# Patient Record
Sex: Male | Born: 1955 | Race: White | Hispanic: No | Marital: Married | Smoking: Current every day smoker
Health system: Southern US, Community
[De-identification: ages and names within clinical notes are randomized; demographics above are authoritative.]

## PROBLEM LIST (undated history)

## (undated) DIAGNOSIS — E785 Hyperlipidemia, unspecified: Secondary | ICD-10-CM

## (undated) DIAGNOSIS — K5792 Diverticulitis of intestine, part unspecified, without perforation or abscess without bleeding: Secondary | ICD-10-CM

## (undated) HISTORY — PX: HEMORRHOID SURGERY: SHX153

## (undated) HISTORY — DX: Diverticulitis of intestine, part unspecified, without perforation or abscess without bleeding: K57.92

## (undated) HISTORY — PX: VASECTOMY: SHX75

## (undated) HISTORY — PX: APPENDECTOMY: SHX54

---

## 2001-07-07 ENCOUNTER — Encounter: Payer: Self-pay | Admitting: Family Medicine

## 2001-07-07 ENCOUNTER — Ambulatory Visit (HOSPITAL_COMMUNITY): Admission: RE | Admit: 2001-07-07 | Discharge: 2001-07-07 | Payer: Self-pay | Admitting: Family Medicine

## 2009-09-26 ENCOUNTER — Emergency Department (HOSPITAL_COMMUNITY): Admission: EM | Admit: 2009-09-26 | Discharge: 2009-09-26 | Payer: Self-pay | Admitting: Emergency Medicine

## 2009-10-01 ENCOUNTER — Encounter: Admission: RE | Admit: 2009-10-01 | Discharge: 2009-10-01 | Payer: Self-pay | Admitting: Family Medicine

## 2009-10-17 ENCOUNTER — Encounter: Admission: RE | Admit: 2009-10-17 | Discharge: 2009-10-17 | Payer: Self-pay | Admitting: Family Medicine

## 2012-03-22 ENCOUNTER — Encounter (INDEPENDENT_AMBULATORY_CARE_PROVIDER_SITE_OTHER): Payer: Self-pay | Admitting: *Deleted

## 2015-11-17 ENCOUNTER — Telehealth: Payer: Self-pay

## 2015-11-17 NOTE — Telephone Encounter (Signed)
[phone]   PATIENT RECEIVED LETTER TO SCHEDULE TCS

## 2015-12-04 NOTE — Telephone Encounter (Signed)
LMOM to call.

## 2015-12-09 NOTE — Telephone Encounter (Signed)
LMOM to call.

## 2015-12-11 NOTE — Telephone Encounter (Signed)
Triaged pt today. See separate triage.

## 2015-12-17 NOTE — Telephone Encounter (Signed)
Gastroenterology Pre-Procedure Review  Request Date: 12/11/2015 Requesting Physician: Dr. Wende Neighbors  PATIENT REVIEW QUESTIONS: The patient responded to the following health history questions as indicated:    1. Diabetes Melitis: no 2. Joint replacements in the past 12 months: no 3. Major health problems in the past 3 months: no 4. Has an artificial valve or MVP: no 5. Has a defibrillator: no 6. Has been advised in past to take antibiotics in advance of a procedure like teeth cleaning: no 7. Family history of colon cancer: no  8. Alcohol Use: USUALLY DRINKS 2-3 BEERS DAILY 9. History of sleep apnea: no     MEDICATIONS & ALLERGIES:    Patient reports the following regarding taking any blood thinners:   Plavix? no Aspirin? yes Coumadin? no  Patient confirms/reports the following medications:  Current Outpatient Prescriptions  Medication Sig Dispense Refill  . aspirin 81 MG chewable tablet Chew by mouth daily.    Marland Kitchen atorvastatin (LIPITOR) 20 MG tablet Take 20 mg by mouth daily.    . Multiple Vitamin (MULTIVITAMIN) tablet Take 1 tablet by mouth daily.     No current facility-administered medications for this visit.     Patient confirms/reports the following allergies:  No Known Allergies  No orders of the defined types were placed in this encounter.   AUTHORIZATION INFORMATION Primary Insurance:  ID #:   Group #:  Pre-Cert / Auth required: Pre-Cert / Auth #:   Secondary Insurance:   ID #:   Group #:  Pre-Cert / Auth required: Pre-Cert / Auth #:   SCHEDULE INFORMATION: Procedure has been scheduled as follows:  Date:                  Time:   Location:   This Gastroenterology Pre-Precedure Review Form is being routed to the following provider(s): R. Garfield Cornea, MD

## 2015-12-22 NOTE — Telephone Encounter (Signed)
Phenergan 25 mg IV on call.  

## 2015-12-31 ENCOUNTER — Other Ambulatory Visit: Payer: Self-pay

## 2015-12-31 DIAGNOSIS — Z1211 Encounter for screening for malignant neoplasm of colon: Secondary | ICD-10-CM

## 2015-12-31 MED ORDER — PROMETHAZINE HCL 25 MG/ML IJ SOLN: 25.0000 mg | Freq: Once | INTRAMUSCULAR | Status: DC

## 2015-12-31 MED ORDER — NA SULFATE-K SULFATE-MG SULF 17.5-3.13-1.6 GM/177ML PO SOLN: 1.0000 | ORAL | 0 refills | Status: DC

## 2015-12-31 NOTE — Addendum Note (Signed)
Addended by: Everardo All on: 12/31/2015 11:22 AM   Modules accepted: Orders

## 2015-12-31 NOTE — Telephone Encounter (Signed)
PT called back and is scheduled for 01/21/2016 at 9:30 Am with Dr. Dudley Major.

## 2015-12-31 NOTE — Telephone Encounter (Signed)
Rx sent to the pharmacy and instructions mailed to pt.  

## 2015-12-31 NOTE — Telephone Encounter (Signed)
LMOM for a return call for date and time for procedure with Dr. Gala Romney.

## 2016-01-02 ENCOUNTER — Telehealth: Payer: Self-pay

## 2016-01-02 NOTE — Telephone Encounter (Signed)
Pt's wife is called and wanted to know if they could chang e the day of his TCS from 01/21/16 to another day. Please call her back

## 2016-01-05 ENCOUNTER — Telehealth: Payer: Self-pay

## 2016-01-05 NOTE — Telephone Encounter (Signed)
Pt's wife called and rescheduled the colonoscopy from 01/21/2016 to 02/04/2016 at 10:30 Am. I will mail new instructions.

## 2016-01-06 NOTE — Telephone Encounter (Signed)
See phone note of 01/05/2016.

## 2016-01-29 ENCOUNTER — Telehealth: Payer: Self-pay

## 2016-01-29 NOTE — Telephone Encounter (Signed)
I called [employer] @ [phone] and spoke to Lequita Asal who said that a PA is not required for a screening colonoscopy.  Reference # [id number].

## 2016-02-04 ENCOUNTER — Encounter (HOSPITAL_COMMUNITY)
Admission: RE | Disposition: A | Discharge status: Home / Self Care | Payer: Self-pay | Source: Ambulatory Visit | Attending: Internal Medicine

## 2016-02-04 ENCOUNTER — Ambulatory Visit (HOSPITAL_COMMUNITY)
Admission: RE | Admit: 2016-02-04 | Discharge: 2016-02-04 | Disposition: A | Discharge status: Home / Self Care | Payer: BLUE CROSS/BLUE SHIELD | Source: Ambulatory Visit | Attending: Internal Medicine | Admitting: Internal Medicine

## 2016-02-04 ENCOUNTER — Encounter (HOSPITAL_COMMUNITY): Payer: Self-pay | Admitting: *Deleted

## 2016-02-04 DIAGNOSIS — K5730 Diverticulosis of large intestine without perforation or abscess without bleeding: Secondary | ICD-10-CM | POA: Insufficient documentation

## 2016-02-04 DIAGNOSIS — Z1212 Encounter for screening for malignant neoplasm of rectum: Secondary | ICD-10-CM | POA: Diagnosis not present

## 2016-02-04 DIAGNOSIS — F17200 Nicotine dependence, unspecified, uncomplicated: Secondary | ICD-10-CM | POA: Diagnosis not present

## 2016-02-04 DIAGNOSIS — Z7982 Long term (current) use of aspirin: Secondary | ICD-10-CM | POA: Diagnosis not present

## 2016-02-04 DIAGNOSIS — D125 Benign neoplasm of sigmoid colon: Secondary | ICD-10-CM | POA: Diagnosis not present

## 2016-02-04 DIAGNOSIS — Z1211 Encounter for screening for malignant neoplasm of colon: Secondary | ICD-10-CM

## 2016-02-04 DIAGNOSIS — Z79899 Other long term (current) drug therapy: Secondary | ICD-10-CM | POA: Diagnosis not present

## 2016-02-04 DIAGNOSIS — E785 Hyperlipidemia, unspecified: Secondary | ICD-10-CM | POA: Diagnosis not present

## 2016-02-04 DIAGNOSIS — D123 Benign neoplasm of transverse colon: Secondary | ICD-10-CM | POA: Insufficient documentation

## 2016-02-04 DIAGNOSIS — D128 Benign neoplasm of rectum: Secondary | ICD-10-CM | POA: Insufficient documentation

## 2016-02-04 HISTORY — DX: Hyperlipidemia, unspecified: E78.5

## 2016-02-04 HISTORY — PX: COLONOSCOPY: SHX5424

## 2016-02-04 SURGERY — COLONOSCOPY
Anesthesia: Moderate Sedation

## 2016-02-04 MED ORDER — MEPERIDINE HCL 100 MG/ML IJ SOLN
INTRAMUSCULAR | Status: AC
  Filled 2016-02-04: qty 2

## 2016-02-04 MED ORDER — MEPERIDINE HCL 100 MG/ML IJ SOLN
INTRAMUSCULAR | Status: DC | PRN
  Administered 2016-02-04: 50 mg via INTRAVENOUS
  Administered 2016-02-04 (×2): 25 mg via INTRAVENOUS
  Administered 2016-02-04: 50 mg via INTRAVENOUS

## 2016-02-04 MED ORDER — ONDANSETRON HCL 4 MG/2ML IJ SOLN
INTRAMUSCULAR | Status: DC | PRN
  Administered 2016-02-04: 4 mg via INTRAVENOUS

## 2016-02-04 MED ORDER — SODIUM CHLORIDE 0.9% FLUSH
INTRAVENOUS | Status: AC
  Filled 2016-02-04: qty 10

## 2016-02-04 MED ORDER — PROMETHAZINE HCL 25 MG/ML IJ SOLN: 12.5000 mg | Freq: Four times a day (QID) | INTRAMUSCULAR | Status: DC | PRN

## 2016-02-04 MED ORDER — SODIUM CHLORIDE 0.9 % IV SOLN
INTRAVENOUS | Status: DC
  Administered 2016-02-04: 1000 mL via INTRAVENOUS

## 2016-02-04 MED ORDER — ONDANSETRON HCL 4 MG/2ML IJ SOLN
INTRAMUSCULAR | Status: AC
  Filled 2016-02-04: qty 2

## 2016-02-04 MED ORDER — MIDAZOLAM HCL 5 MG/5ML IJ SOLN
INTRAMUSCULAR | Status: AC
  Filled 2016-02-04: qty 10

## 2016-02-04 MED ORDER — PROMETHAZINE HCL 25 MG/ML IJ SOLN
INTRAMUSCULAR | Status: AC
  Filled 2016-02-04: qty 1

## 2016-02-04 MED ORDER — MIDAZOLAM HCL 5 MG/5ML IJ SOLN
INTRAMUSCULAR | Status: DC | PRN
  Administered 2016-02-04: 2 mg via INTRAVENOUS
  Administered 2016-02-04: 1 mg via INTRAVENOUS
  Administered 2016-02-04: 2 mg via INTRAVENOUS
  Administered 2016-02-04: 1 mg via INTRAVENOUS

## 2016-02-04 NOTE — H&P (Signed)
@  [email or web address]@   Primary Care Physician:  Pcp Not In System  Dr. Wende Neighbors Primary Gastroenterologist: Dr. Gala Romney Pre-Procedure History & Physical: HPI:  Bryan Hurley is a 60 y.o. male is here for a screening colonoscopy. No prior colonoscopy. No bowel symptoms. No family history of colon cancer.  Past Medical History:  Diagnosis Date  . Hyperlipidemia     Past Surgical History:  Procedure Laterality Date  . APPENDECTOMY    . HEMORRHOID SURGERY    . VASECTOMY      Prior to Admission medications   Medication Sig Start Date End Date Taking? Authorizing Provider  aspirin 81 MG chewable tablet Chew 81 mg by mouth daily.    Yes Historical Provider, MD  atorvastatin (LIPITOR) 20 MG tablet Take 20 mg by mouth daily.   Yes Historical Provider, MD  calcium carbonate (TUMS - DOSED IN MG ELEMENTAL CALCIUM) 500 MG chewable tablet Chew 1 tablet by mouth daily as needed for indigestion or heartburn.   Yes Historical Provider, MD  Multiple Vitamin (MULTIVITAMIN) tablet Take 1 tablet by mouth daily.   Yes Historical Provider, MD  Na Sulfate-K Sulfate-Mg Sulf (SUPREP BOWEL PREP KIT) 17.5-3.13-1.6 GM/180ML SOLN Take 1 kit by mouth as directed. 12/31/15  Yes Daneil Dolin, MD    Allergies as of 12/31/2015  . (No Known Allergies)    Family History  Problem Relation Age of Onset  . Stroke Father   . Pancreatic cancer Sister     Social History   Social History  . Marital status: Married    Spouse name: N/A  . Number of children: N/A  . Years of education: N/A   Occupational History  . Not on file.   Social History Main Topics  . Smoking status: Current Every Day Smoker  . Smokeless tobacco: Never Used  . Alcohol use Yes     Comment: beer almost daily and occasional mixed drink  . Drug use:     Types: Marijuana  . Sexual activity: Not on file   Other Topics Concern  . Not on file   Social History Narrative  . No narrative on file    Review of Systems: See HPI, otherwise negative  ROS  Physical Exam: BP 138/81   Pulse 67   Temp 97.7 F (36.5 C) (Oral)   Resp 19   Ht '5\' 8"'$  (1.727 m)   Wt 200 lb (90.7 kg)   SpO2 97%   BMI 30.41 kg/m  General:   Alert,  Well-developed, well-nourished, pleasant and cooperative in NAD Head:  Normocephalic and atraumatic. Eyes:  Sclera clear, no icterus.   Conjunctiva pink. Lungs:  Clear throughout to auscultation.   No wheezes, crackles, or rhonchi. No acute distress. Heart:  Regular rate and rhythm; no murmurs, clicks, rubs,  or gallops. Abdomen:  Soft, nontender and nondistended. No masses, hepatosplenomegaly or hernias noted. Normal bowel sounds, without guarding, and without rebound.    Impression/Plan: Bryan Hurley is now here to undergo a screening colonoscopy.  First ever average risk screening examination.  Risks, benefits, limitations, imponderables and alternatives regarding colonoscopy have been reviewed with the patient. Questions have been answered. All parties agreeable.     Notice:  This dictation was prepared with Dragon dictation along with smaller phrase technology. Any transcriptional errors that result from this process are unintentional and may not be corrected upon review.

## 2016-02-04 NOTE — Op Note (Signed)
[facility] Patient Name: Bryan Hurley Procedure Date: 02/04/2016 10:02 AM MRN: [id number] Date of Birth: [birth year] Attending MD: Norvel Richards , MD CSN: [id number] Age: 60 Admit Type: Outpatient Procedure:                Colonoscopy with multiple snare polypectomies Indications:              Screening for colorectal malignant neoplasm Providers:                Norvel Richards, MD, Lurline Del, RN, Isabella Stalling, Technician Referring MD:             Delphina Cahill, MD Medicines:                Midazolam 6 mg IV, Meperidine 150 mg IV,                            Ondansetron 4 mg IV Complications:            No immediate complications. Estimated Blood Loss:     Estimated blood loss was minimal. Procedure:                Pre-Anesthesia Assessment:                           - Prior to the procedure, a History and Physical                            was performed, and patient medications and                            allergies were reviewed. The patient's tolerance of                            previous anesthesia was also reviewed. The risks                            and benefits of the procedure and the sedation                            options and risks were discussed with the patient.                            All questions were answered, and informed consent                            was obtained. Prior Anticoagulants: The patient has                            taken no previous anticoagulant or antiplatelet                            agents. ASA Grade Assessment: II - A patient with  mild systemic disease. After reviewing the risks                            and benefits, the patient was deemed in                            satisfactory condition to undergo the procedure.                           After obtaining informed consent, the colonoscope                            was passed under direct vision.  Throughout the                            procedure, the patient's blood pressure, pulse, and                            oxygen saturations were monitored continuously. The                            EC-[id number]) scope was introduced through                            the anus and advanced to the the cecum, identified                            by appendiceal orifice and ileocecal valve. The                            colonoscopy was performed without difficulty. The                            patient tolerated the procedure well. The quality                            of the bowel preparation was adequate. The                            ileocecal valve, appendiceal orifice, and rectum                            were photographed. The entire colon was well                            visualized. The ileocecal valve, appendiceal                            orifice, and rectum were photographed. Scope In: 10:14:43 AM Scope Out: 10:30:56 AM Scope Withdrawal Time: 0 hours 12 minutes 19 seconds  Total Procedure Duration: 0 hours 16 minutes 13 seconds  Findings:      The perianal and digital rectal examinations were normal.      Multiple small and large-mouthed diverticula were found in the sigmoid  colon and descending colon.      Three pedunculated polyps were found in the rectum, sigmoid colon and       hepatic flexure. 1 cm sessile polyp at the hepatic flexure and a 1.25 cm       pedunculated in the sigmoid segment were removed with a hot snare.       Resection and retrieval were complete. Removal of a 5 mm rectal polyp       was accomplished with cold snare technique. Estimated blood loss:       minimal.. Impression:               - Diverticulosis in the sigmoid colon and in the                            descending colon.                           - Three polyps in the rectum, in the sigmoid colon                            and at the hepatic flexure, removed as described                             above. Resected and retrieved. Moderate Sedation:      Moderate (conscious) sedation was administered by the endoscopy nurse       and supervised by the endoscopist. The following parameters were       monitored: oxygen saturation, heart rate, blood pressure, respiratory       rate, EKG, adequacy of pulmonary ventilation, and response to care.       Total physician intraservice time was 28 minutes. Recommendation:           - Patient has a contact number available for                            emergencies. The signs and symptoms of potential                            delayed complications were discussed with the                            patient. Return to normal activities tomorrow.                            Written discharge instructions were provided to the                            patient.                           - Resume previous diet.                           - Continue present medications.                           - Repeat colonoscopy date to be  determined after                            pending pathology results are reviewed for                            surveillance based on pathology results.                           - Return to GI office (date not yet determined). Procedure Code(s):        --- Professional ---                           [phone], Colonoscopy, flexible; with removal of                            tumor(s), polyp(s), or other lesion(s) by snare                            technique                           99152, Moderate sedation services provided by the                            same physician or other qualified health care                            professional performing the diagnostic or                            therapeutic service that the sedation supports,                            requiring the presence of an independent trained                            observer to assist in the monitoring of the                             patient's level of consciousness and physiological                            status; initial 15 minutes of intraservice time,                            patient age 20 years or older                           [phone], Moderate sedation services; each additional                            15 minutes intraservice time Diagnosis Code(s):        --- Professional ---  Z12.11, Encounter for screening for malignant                            neoplasm of colon                           K62.1, Rectal polyp                           D12.5, Benign neoplasm of sigmoid colon                           D12.3, Benign neoplasm of transverse colon (hepatic                            flexure or splenic flexure)                           K57.30, Diverticulosis of large intestine without                            perforation or abscess without bleeding CPT copyright 2016 American Medical Association. All rights reserved. The codes documented in this report are preliminary and upon coder review may  be revised to meet current compliance requirements. Bryan Hurley. Vir Whetstine, MD Norvel Richards, MD 02/04/2016 10:43:36 AM This report has been signed electronically. Number of Addenda: 0

## 2016-02-04 NOTE — Discharge Instructions (Addendum)
°Colonoscopy °Discharge Instructions ° °Read the instructions outlined below and refer to this sheet in the next few weeks. These discharge instructions provide you with general information on caring for yourself after you leave the hospital. Your doctor may also give you specific instructions. While your treatment has been planned according to the most current medical practices available, unavoidable complications occasionally occur. If you have any problems or questions after discharge, call Dr. Rourk at [phone]. °ACTIVITY °· You may resume your regular activity, but move at a slower pace for the next 24 hours.  °· Take frequent rest periods for the next 24 hours.  °· Walking will help get rid of the air and reduce the bloated feeling in your belly (abdomen).  °· No driving for 24 hours (because of the medicine (anesthesia) used during the test).   °· Do not sign any important legal documents or operate any machinery for 24 hours (because of the anesthesia used during the test).  °NUTRITION °· Drink plenty of fluids.  °· You may resume your normal diet as instructed by your doctor.  °· Begin with a light meal and progress to your normal diet. Heavy or fried foods are harder to digest and may make you feel sick to your stomach (nauseated).  °· Avoid alcoholic beverages for 24 hours or as instructed.  °MEDICATIONS °· You may resume your normal medications unless your doctor tells you otherwise.  °WHAT YOU CAN EXPECT TODAY °· Some feelings of bloating in the abdomen.  °· Passage of more gas than usual.  °· Spotting of blood in your stool or on the toilet paper.  °IF YOU HAD POLYPS REMOVED DURING THE COLONOSCOPY: °· No aspirin products for 7 days or as instructed.  °· No alcohol for 7 days or as instructed.  °· Eat a soft diet for the next 24 hours.  °FINDING OUT THE RESULTS OF YOUR TEST °Not all test results are available during your visit. If your test results are not back during the visit, make an appointment  with your caregiver to find out the results. Do not assume everything is normal if you have not heard from your caregiver or the medical facility. It is important for you to follow up on all of your test results.  °SEEK IMMEDIATE MEDICAL ATTENTION IF: °· You have more than a spotting of blood in your stool.  °· Your belly is swollen (abdominal distention).  °· You are nauseated or vomiting.  °· You have a temperature over 101.  °· You have abdominal pain or discomfort that is severe or gets worse throughout the day.  ° ° °Colon polyp and diverticulosis information provided ° °Further recommendations to follow pending review of pathology report ° ° °Diverticulosis °Diverticulosis is the condition that develops when small pouches (diverticula) form in the wall of your colon. Your colon, or large intestine, is where water is absorbed and stool is formed. The pouches form when the inside layer of your colon pushes through weak spots in the outer layers of your colon. °CAUSES  °No one knows exactly what causes diverticulosis. °RISK FACTORS °· Being older than 50. Your risk for this condition increases with age. Diverticulosis is rare in people younger than 40 years. By age 80, almost everyone has it. °· Eating a low-fiber diet. °· Being frequently constipated. °· Being overweight. °· Not getting enough exercise. °· Smoking. °· Taking over-the-counter pain medicines, like aspirin and ibuprofen. °SYMPTOMS  °Most people with diverticulosis do not have symptoms. °DIAGNOSIS  °  Because diverticulosis often has no symptoms, health care providers often discover the condition during an exam for other colon problems. In many cases, a health care provider will diagnose diverticulosis while using a flexible scope to examine the colon (colonoscopy). °TREATMENT  °If you have never developed an infection related to diverticulosis, you may not need treatment. If you have had an infection before, treatment may include: °· Eating more  fruits, vegetables, and grains. °· Taking a fiber supplement. °· Taking a live bacteria supplement (probiotic). °· Taking medicine to relax your colon. °HOME CARE INSTRUCTIONS  °· Drink at least 6-8 glasses of water each day to prevent constipation. °· Try not to strain when you have a bowel movement. °· Keep all follow-up appointments. °If you have had an infection before:  °· Increase the fiber in your diet as directed by your health care provider or dietitian. °· Take a dietary fiber supplement if your health care provider approves. °· Only take medicines as directed by your health care provider. °SEEK MEDICAL CARE IF:  °· You have abdominal pain. °· You have bloating. °· You have cramps. °· You have not gone to the bathroom in 3 days. °SEEK IMMEDIATE MEDICAL CARE IF:  °· Your pain gets worse. °· Your bloating becomes very bad. °· You have a fever or chills, and your symptoms suddenly get worse. °· You begin vomiting. °· You have bowel movements that are bloody or black. °MAKE SURE YOU: °· Understand these instructions. °· Will watch your condition. °· Will get help right away if you are not doing well or get worse. °  °This information is not intended to replace advice given to you by your health care provider. Make sure you discuss any questions you have with your health care provider. °  °Document Released: 12/25/2003 Document Revised: 04/03/2013 Document Reviewed: 02/21/2013 °Elsevier Interactive Patient Education ©2016 [employer] Inc[employer] °Colon Polyps °Polyps are lumps of extra tissue growing inside the body. Polyps can grow in the large intestine (colon). Most colon polyps are noncancerous (benign). However, some colon polyps can become cancerous over time. Polyps that are larger than a pea may be harmful. To be safe, caregivers remove and test all polyps. °CAUSES  °Polyps form when mutations in the genes cause your cells to grow and divide even though no more tissue is needed. °RISK FACTORS °There are a number  of risk factors that can increase your chances of getting colon polyps. They include: °· Being older than 50 years. °· Family history of colon polyps or colon cancer. °· Long-term colon diseases, such as colitis or Crohn disease. °· Being overweight. °· Smoking. °· Being inactive. °· Drinking too much alcohol. °SYMPTOMS  °Most small polyps do not cause symptoms. If symptoms are present, they may include: °· Blood in the stool. The stool may look dark red or black. °· Constipation or diarrhea that lasts longer than 1 week. °DIAGNOSIS °People often do not know they have polyps until their caregiver finds them during a regular checkup. Your caregiver can use 4 tests to check for polyps: °· Digital rectal exam. The caregiver wears gloves and feels inside the rectum. This test would find polyps only in the rectum. °· Barium enema. The caregiver puts a liquid called barium into your rectum before taking X-rays of your colon. Barium makes your colon look white. Polyps are dark, so they are easy to see in the X-ray pictures. °· Sigmoidoscopy. A thin, flexible tube (sigmoidoscope) is placed into your rectum. The sigmoidoscope has a   light and tiny camera in it. The caregiver uses the sigmoidoscope to look at the last third of your colon. °· Colonoscopy. This test is like sigmoidoscopy, but the caregiver looks at the entire colon. This is the most common method for finding and removing polyps. °TREATMENT  °Any polyps will be removed during a sigmoidoscopy or colonoscopy. The polyps are then tested for cancer. °PREVENTION  °To help lower your risk of getting more colon polyps: °· Eat plenty of fruits and vegetables. Avoid eating fatty foods. °· Do not smoke. °· Avoid drinking alcohol. °· Exercise every day. °· Lose weight if recommended by your caregiver. °· Eat plenty of calcium and folate. Foods that are rich in calcium include milk, cheese, and broccoli. Foods that are rich in folate include chickpeas, kidney beans, and  spinach. °HOME CARE INSTRUCTIONS °Keep all follow-up appointments as directed by your caregiver. You may need periodic exams to check for polyps. °SEEK MEDICAL CARE IF: °You notice bleeding during a bowel movement. °  °This information is not intended to replace advice given to you by your health care provider. Make sure you discuss any questions you have with your health care provider. °  °Document Released: 12/24/2003 Document Revised: 04/19/2014 Document Reviewed: 06/08/2011 °Elsevier Interactive Patient Education ©2016 Elsevier Inc. ° °

## 2016-02-06 ENCOUNTER — Encounter: Payer: Self-pay | Admitting: Internal Medicine

## 2016-02-09 ENCOUNTER — Encounter (HOSPITAL_COMMUNITY): Payer: Self-pay | Admitting: Internal Medicine

## 2016-02-11 ENCOUNTER — Telehealth: Payer: Self-pay | Admitting: Internal Medicine

## 2016-02-11 NOTE — Telephone Encounter (Signed)
Tried to call pt- NA-LMOM- informing him that a letter was mailed out on Monday.

## 2016-02-11 NOTE — Telephone Encounter (Signed)
[id number] patient wife called wanting to know results of patient biopsies

## 2018-02-28 ENCOUNTER — Encounter: Payer: Self-pay | Admitting: Internal Medicine

## 2018-05-24 ENCOUNTER — Encounter: Payer: Self-pay | Admitting: Nurse Practitioner

## 2018-05-24 ENCOUNTER — Ambulatory Visit: Payer: BLUE CROSS/BLUE SHIELD | Admitting: Nurse Practitioner

## 2018-05-24 ENCOUNTER — Encounter: Payer: Self-pay | Admitting: *Deleted

## 2018-05-24 ENCOUNTER — Other Ambulatory Visit: Payer: Self-pay | Admitting: *Deleted

## 2018-05-24 ENCOUNTER — Telehealth: Payer: Self-pay | Admitting: *Deleted

## 2018-05-24 DIAGNOSIS — K5732 Diverticulitis of large intestine without perforation or abscess without bleeding: Secondary | ICD-10-CM | POA: Diagnosis not present

## 2018-05-24 DIAGNOSIS — Z86010 Personal history of colonic polyps: Secondary | ICD-10-CM | POA: Diagnosis not present

## 2018-05-24 MED ORDER — PEG 3350-KCL-NA BICARB-NACL 420 G PO SOLR: 4000.0000 mL | Freq: Once | ORAL | 0 refills | Status: AC

## 2018-05-24 NOTE — Assessment & Plan Note (Signed)
Previous colonoscopy completed 3 years ago which found diverticular disease as well as 3 large polyps.  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  Another one was 1.3 cm.  Recommended repeat exam in 3 years.  He is currently due.  Proceed with TCS on propofol/MAC with Dr. Gala Romney in near future: the risks, benefits, and alternatives have been discussed with the patient in detail. The patient states understanding and desires to proceed.  The patient is currently on Phenergan.  Drinks 3-4 beers a day, most days.  Occasional mixed drink.  No other anticoagulants, anxiolytics, chronic pain medications, or antidepressants.  Even his last procedure was completed on conscious sedation we will plan for propofol/MAC given medications and alcohol consumption in order to promote adequate sedation.

## 2018-05-24 NOTE — Telephone Encounter (Signed)
Pre-op scheduled for 07/11/2018 at 9:00am. Letter mailed. Received message wireless customer is not available.

## 2018-05-24 NOTE — Progress Notes (Signed)
CC'D TO PCP °

## 2018-05-24 NOTE — Patient Instructions (Addendum)
Your health issues we discussed today were:   Diverticulitis: 1. Let your primary care provider know of any recurrent symptoms 2. Call us if you have any worsening issues  Need for colonoscopy: 1. You are due for repeat colonoscopy due to the number and size of polyps found last time 2. We will schedule your colonoscopy for you. 3. Further recommendations to follow your colonoscopy  Overall I recommend:  1. Return for follow-up based on recommendations after your colonoscopy, or as needed for GI issues 2. Call us if you have any questions or concerns.  At [facility] we value your feedback. You may receive a survey about your visit today. Please share your experience as we strive to create trusting relationships with our patients to provide genuine, compassionate, quality care.  We appreciate your understanding and patience as we review any laboratory studies, imaging, and other diagnostic tests that are ordered as we care for you. Our office policy is 5 business days for review of these results, and any emergent or urgent results are addressed in a timely manner for your best interest. If you do not hear from our office in 1 week, please contact us.   We also encourage the use of MyChart, which contains your medical information for your review as well. If you are not enrolled in this feature, an access code is on this after visit summary for your convenience. Thank you for allowing [place] to be involved in your care.  It was great to see you today!  I hope you have a great day!!

## 2018-05-24 NOTE — Assessment & Plan Note (Signed)
Previously diagnosed with diverticulitis by primary care and treated with Cipro and Flagyl.  His abdominal pain resolved rapidly on this.  No ongoing or persistent symptoms.  Follow-up as needed.

## 2018-05-24 NOTE — Progress Notes (Signed)
Referring Provider: Celene Squibb, MD Primary Care Physician:  Celene Squibb, MD Primary GI:  Dr. Gala Romney  Chief Complaint  Patient presents with  . Colonoscopy    consult    HPI:   Bryan Hurley is a 63 y.o. male who presents on referral from primary care to schedule colonoscopy as well as left lower quadrant pain and history of diverticular disease.  Reviewed information provided with referral including office visit dated 02/27/2018.  That was a follow-up for diverticulitis.  At that time noted diarrhea and some loose stools.  Left lower abdominal pain resolved with complete meant of Cipro and Flagyl antibiotics.  Noted to be due for colonoscopy and referred back to GI.  Previous colonoscopy dated 02/04/2016 which found multiple small and large mouth diverticula in the sigmoid and descending colon, 3 pedunculated polyps in the rectum, sigmoid colon, hepatic flexure.  The hepatic flexure polyp was 1 cm and the sigmoid segment polyp was 1.25 cm.  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  Recommended repeat colonoscopy in 3 years (2020).  Today he states he's doing ok overall. Abdominal pain resolved at this time after antibiotics. Denies abdominal pain, N/V, hematochezia, melena, chills, unintentional weight loss. Has a fever for one day last week after returning from a cruise, lasted 2 days. Denies chest pain, dyspnea, dizziness, lightheadedness, syncope, near syncope. Denies any other upper or lower GI symptoms.  Past Medical History:  Diagnosis Date  . Diverticulitis   . Hyperlipidemia     Past Surgical History:  Procedure Laterality Date  . APPENDECTOMY    . COLONOSCOPY N/A 02/04/2016   Procedure: COLONOSCOPY;  Surgeon: Daneil Dolin, MD;  Location: [facility];  Service: Endoscopy;  Laterality: N/A;  9:30 AM - moved to 10/25 @ 10:30  . HEMORRHOID SURGERY    . VASECTOMY      Current Outpatient  Medications  Medication Sig Dispense Refill  . aspirin 81 MG chewable tablet Chew 81 mg by mouth daily.     Marland Kitchen atorvastatin (LIPITOR) 20 MG tablet Take 10 mg by mouth daily.     . calcium carbonate (TUMS - DOSED IN MG ELEMENTAL CALCIUM) 500 MG chewable tablet Chew 1 tablet by mouth daily as needed for indigestion or heartburn.    . Multiple Vitamin (MULTIVITAMIN) tablet Take 1 tablet by mouth daily.    . Omega-3 Fatty Acids (FISH OIL PO) Take by mouth daily.    . TURMERIC PO Take by mouth daily.    Marland Kitchen VITAMIN D PO Take by mouth daily.    Marland Kitchen VITAMIN E PO Take by mouth daily.    . Na Sulfate-K Sulfate-Mg Sulf (SUPREP BOWEL PREP KIT) 17.5-3.13-1.6 GM/180ML SOLN Take 1 kit by mouth as directed. (Patient not taking: Reported on 05/24/2018) 1 Bottle 0   Current Facility-Administered Medications  Medication Dose Route Frequency Provider Last Rate Last Dose  . promethazine (PHENERGAN) injection 25 mg  25 mg Intravenous Once Daneil Dolin, MD        Allergies as of 05/24/2018  . (No Known Allergies)    Family History  Problem Relation Age of Onset  . Stroke Father   . Pancreatic cancer Sister   . Colon cancer Neg Hx     Social History   Socioeconomic History  . Marital status: Married    Spouse name: Not on file  . Number of children: Not on file  .  Years of education: Not on file  . Highest education level: Not on file  Occupational History  . Not on file  Social Needs  . Financial resource strain: Not on file  . Food insecurity:    Worry: Not on file    Inability: Not on file  . Transportation needs:    Medical: Not on file    Non-medical: Not on file  Tobacco Use  . Smoking status: Current Every Day Smoker    Packs/day: 0.50    Years: 49.00    Pack years: 24.50    Types: Cigarettes    Start date: 05/24/1969  . Smokeless tobacco: Never Used  Substance and Sexual Activity  . Alcohol use: Yes    Comment: 3-4 beers almost daily and occasional mixed drink  . Drug use: Yes      Types: Marijuana  . Sexual activity: Not on file  Lifestyle  . Physical activity:    Days per week: Not on file    Minutes per session: Not on file  . Stress: Not on file  Relationships  . Social connections:    Talks on phone: Not on file    Gets together: Not on file    Attends religious service: Not on file    Active member of club or organization: Not on file    Attends meetings of clubs or organizations: Not on file    Relationship status: Not on file  Other Topics Concern  . Not on file  Social History Narrative  . Not on file    Review of Systems: General: Negative for anorexia, weight loss, fever, chills, fatigue, weakness. ENT: Negative for hoarseness, difficulty swallowing. CV: Negative for chest pain, angina, palpitations, peripheral edema.  Respiratory: Negative for dyspnea at rest, cough, sputum, wheezing.  GI: See history of present illness. Endo: Negative for unusual weight change.  Heme: Negative for bruising or bleeding. Allergy: Negative for rash or hives.   Physical Exam: BP (!) 153/78   Pulse 85   Temp 98 F (36.7 C) (Oral)   Ht 5' 9"  (1.753 m)   Wt 207 lb (93.9 kg)   BMI 30.57 kg/m  General:   Alert and oriented. Pleasant and cooperative. Well-nourished and well-developed.  Eyes:  Without icterus, sclera clear and conjunctiva pink.  Ears:  Normal auditory acuity. Cardiovascular:  S1, S2 present without murmurs appreciated. Extremities without clubbing or edema. Respiratory:  Clear to auscultation bilaterally. No wheezes, rales, or rhonchi. No distress.  Gastrointestinal:  +BS, soft, non-tender and non-distended. No HSM noted. No guarding or rebound. No masses appreciated.  Rectal:  Deferred  Musculoskalatal:  Symmetrical without gross deformities. Neurologic:  Alert and oriented x4;  grossly normal neurologically. Psych:  Alert and cooperative. Normal mood and affect. Heme/Lymph/Immune: No excessive bruising noted.    05/24/2018 9:04  AM   Disclaimer: This note was dictated with voice recognition software. Similar sounding words can inadvertently be transcribed and may not be corrected upon review.

## 2018-07-10 ENCOUNTER — Telehealth: Payer: Self-pay | Admitting: *Deleted

## 2018-07-10 NOTE — Telephone Encounter (Signed)
Patient called endo to r/s procedure TCS w/ propofol with RMR. I called patient and he wants to r/s for date 7/27 or 7/30. Made patient aware we currently do not have the July schedule but will call to r/s once we do. He voiced understanding.

## 2018-07-11 ENCOUNTER — Encounter (HOSPITAL_COMMUNITY): Admission: RE | Admit: 2018-07-11 | Payer: BLUE CROSS/BLUE SHIELD | Source: Ambulatory Visit

## 2018-07-25 NOTE — Telephone Encounter (Signed)
Spoke with patient and he has r/s'd procedure to 7/30 at 9am. Patient aware will mail new instructions/pre-op appt. Address confirmed. LMOVM for endo making aware of appt change.

## 2018-11-03 ENCOUNTER — Encounter (HOSPITAL_COMMUNITY): Payer: Self-pay

## 2018-11-03 ENCOUNTER — Other Ambulatory Visit: Payer: Self-pay

## 2018-11-03 ENCOUNTER — Encounter (HOSPITAL_COMMUNITY)
Admission: RE | Admit: 2018-11-03 | Discharge: 2018-11-03 | Disposition: A | Discharge status: Home / Self Care | Payer: BC Managed Care – PPO | Source: Ambulatory Visit | Attending: Internal Medicine | Admitting: Internal Medicine

## 2018-11-06 ENCOUNTER — Other Ambulatory Visit (HOSPITAL_COMMUNITY)
Admission: RE | Admit: 2018-11-06 | Discharge: 2018-11-06 | Disposition: A | Discharge status: Home / Self Care | Payer: BC Managed Care – PPO | Source: Ambulatory Visit | Attending: Internal Medicine | Admitting: Internal Medicine

## 2018-11-06 ENCOUNTER — Other Ambulatory Visit: Payer: Self-pay

## 2018-11-06 DIAGNOSIS — Z20828 Contact with and (suspected) exposure to other viral communicable diseases: Secondary | ICD-10-CM | POA: Insufficient documentation

## 2018-11-07 LAB — SARS CORONAVIRUS 2 (TAT 6-24 HRS): SARS Coronavirus 2: NEGATIVE

## 2018-11-09 ENCOUNTER — Ambulatory Visit (HOSPITAL_COMMUNITY): Payer: BC Managed Care – PPO | Admitting: Anesthesiology

## 2018-11-09 ENCOUNTER — Encounter (HOSPITAL_COMMUNITY): Payer: Self-pay | Admitting: *Deleted

## 2018-11-09 ENCOUNTER — Ambulatory Visit (HOSPITAL_COMMUNITY)
Admission: RE | Admit: 2018-11-09 | Discharge: 2018-11-09 | Disposition: A | Discharge status: Home / Self Care | Payer: BC Managed Care – PPO | Attending: Internal Medicine | Admitting: Internal Medicine

## 2018-11-09 ENCOUNTER — Other Ambulatory Visit: Payer: Self-pay

## 2018-11-09 ENCOUNTER — Encounter (HOSPITAL_COMMUNITY)
Admission: RE | Disposition: A | Discharge status: Home / Self Care | Payer: Self-pay | Source: Home / Self Care | Attending: Internal Medicine

## 2018-11-09 DIAGNOSIS — E785 Hyperlipidemia, unspecified: Secondary | ICD-10-CM | POA: Diagnosis not present

## 2018-11-09 DIAGNOSIS — K635 Polyp of colon: Secondary | ICD-10-CM

## 2018-11-09 DIAGNOSIS — F17210 Nicotine dependence, cigarettes, uncomplicated: Secondary | ICD-10-CM | POA: Diagnosis not present

## 2018-11-09 DIAGNOSIS — D124 Benign neoplasm of descending colon: Secondary | ICD-10-CM | POA: Insufficient documentation

## 2018-11-09 DIAGNOSIS — K5730 Diverticulosis of large intestine without perforation or abscess without bleeding: Secondary | ICD-10-CM | POA: Insufficient documentation

## 2018-11-09 DIAGNOSIS — Z86010 Personal history of colonic polyps: Secondary | ICD-10-CM | POA: Diagnosis present

## 2018-11-09 DIAGNOSIS — Z79899 Other long term (current) drug therapy: Secondary | ICD-10-CM | POA: Insufficient documentation

## 2018-11-09 DIAGNOSIS — Z7982 Long term (current) use of aspirin: Secondary | ICD-10-CM | POA: Insufficient documentation

## 2018-11-09 DIAGNOSIS — Z1211 Encounter for screening for malignant neoplasm of colon: Secondary | ICD-10-CM | POA: Insufficient documentation

## 2018-11-09 HISTORY — PX: COLONOSCOPY WITH PROPOFOL: SHX5780

## 2018-11-09 HISTORY — PX: POLYPECTOMY: SHX5525

## 2018-11-09 SURGERY — COLONOSCOPY WITH PROPOFOL
Anesthesia: General

## 2018-11-09 MED ORDER — KETAMINE HCL 50 MG/5ML IJ SOSY
PREFILLED_SYRINGE | INTRAMUSCULAR | Status: AC
  Filled 2018-11-09: qty 5

## 2018-11-09 MED ORDER — MIDAZOLAM HCL 2 MG/2ML IJ SOLN: 0.5000 mg | Freq: Once | INTRAMUSCULAR | Status: DC | PRN

## 2018-11-09 MED ORDER — GLYCOPYRROLATE 0.2 MG/ML IJ SOLN
INTRAMUSCULAR | Status: DC | PRN
  Administered 2018-11-09: 0.2 mg via INTRAVENOUS

## 2018-11-09 MED ORDER — PROMETHAZINE HCL 25 MG/ML IJ SOLN: 6.2500 mg | INTRAMUSCULAR | Status: DC | PRN

## 2018-11-09 MED ORDER — KETAMINE HCL 10 MG/ML IJ SOLN
INTRAMUSCULAR | Status: DC | PRN
  Administered 2018-11-09: 20 mg via INTRAVENOUS

## 2018-11-09 MED ORDER — PROPOFOL 500 MG/50ML IV EMUL
INTRAVENOUS | Status: DC | PRN
  Administered 2018-11-09: 150 ug/kg/min via INTRAVENOUS

## 2018-11-09 MED ORDER — CHLORHEXIDINE GLUCONATE CLOTH 2 % EX PADS: 6.0000 | MEDICATED_PAD | Freq: Once | CUTANEOUS | Status: DC

## 2018-11-09 MED ORDER — LIDOCAINE HCL (CARDIAC) PF 100 MG/5ML IV SOSY
PREFILLED_SYRINGE | INTRAVENOUS | Status: DC | PRN
  Administered 2018-11-09: 40 mg via INTRAVENOUS

## 2018-11-09 MED ORDER — HYDROMORPHONE HCL 1 MG/ML IJ SOLN: 0.2500 mg | INTRAMUSCULAR | Status: DC | PRN

## 2018-11-09 MED ORDER — PROPOFOL 10 MG/ML IV BOLUS
INTRAVENOUS | Status: AC
  Filled 2018-11-09: qty 40

## 2018-11-09 MED ORDER — PROPOFOL 10 MG/ML IV BOLUS
INTRAVENOUS | Status: DC | PRN
  Administered 2018-11-09: 20 mg via INTRAVENOUS

## 2018-11-09 MED ORDER — HYDROCODONE-ACETAMINOPHEN 7.5-325 MG PO TABS: 1.0000 | ORAL_TABLET | Freq: Once | ORAL | Status: DC | PRN

## 2018-11-09 MED ORDER — LACTATED RINGERS IV SOLN
INTRAVENOUS | Status: DC
  Administered 2018-11-09: 09:00:00 via INTRAVENOUS

## 2018-11-09 NOTE — Op Note (Signed)
[facility] Patient Name: Bryan Hurley Procedure Date: 11/09/2018 8:57 AM MRN: [id number] Date of Birth: [birth year] Attending MD: Norvel Richards , MD CSN: [id number] Age: 63 Admit Type: Outpatient Procedure:                Colonoscopy Indications:              High risk colon cancer surveillance: Personal                            history of colonic polyps Providers:                Norvel Richards, MD, Otis Peak B. Sharon Seller, RN,                            Raphael Gibney, Technician Referring MD:              Medicines:                Propofol per Anesthesia Complications:            No immediate complications. Estimated Blood Loss:     Estimated blood loss was minimal. Procedure:                Pre-Anesthesia Assessment:                           - Prior to the procedure, a History and Physical                            was performed, and patient medications and                            allergies were reviewed. The patient's tolerance of                            previous anesthesia was also reviewed. The risks                            and benefits of the procedure and the sedation                            options and risks were discussed with the patient.                            All questions were answered, and informed consent                            was obtained. Prior Anticoagulants: The patient has                            taken no previous anticoagulant or antiplatelet                            agents. ASA Grade Assessment: II - A patient with  mild systemic disease. After reviewing the risks                            and benefits, the patient was deemed in                            satisfactory condition to undergo the procedure.                           After obtaining informed consent, the colonoscope                            was passed under direct vision. Throughout the                            procedure, the  patient's blood pressure, pulse, and                            oxygen saturations were monitored continuously. The                            CF-[id number] ([id number]) scope was introduced through                            the anus and advanced to the 5 cm into the ileum.                            The colonoscopy was performed without difficulty.                            The patient tolerated the procedure well. The                            quality of the bowel preparation was adequate. Scope In: 9:21:47 AM Scope Out: 9:35:55 AM Scope Withdrawal Time: 0 hours 9 minutes 12 seconds  Total Procedure Duration: 0 hours 14 minutes 8 seconds  Findings:      The perianal and digital rectal examinations were normal.      A 5 mm polyp was found in the descending colon. The polyp was sessile.       The polyp was removed with a cold snare. Resection and retrieval were       complete. Estimated blood loss was minimal.      Scattered small and large-mouthed diverticula were found in the sigmoid       colon.      The exam was otherwise without abnormality on direct and retroflexion       views. Impression:               - One 5 mm polyp in the descending colon, removed                            with a cold snare. Resected and retrieved.                           -  Diverticulosis in the sigmoid colon.                           - The examination was otherwise normal on direct                            and retroflexion views. Moderate Sedation:      Moderate (conscious) sedation was personally administered by an       anesthesia professional. The following parameters were monitored: oxygen       saturation, heart rate, blood pressure, respiratory rate, EKG, adequacy       of pulmonary ventilation, and response to care. Recommendation:           - Patient has a contact number available for                            emergencies. The signs and symptoms of potential                            delayed  complications were discussed with the                            patient. Return to normal activities tomorrow.                            Written discharge instructions were provided to the                            patient.                           - Resume previous diet.                           - Continue present medications.                           - Repeat colonoscopy date to be determined after                            pending pathology results are reviewed for                            surveillance based on pathology results.                           - Return to GI office (date not yet determined). Procedure Code(s):        --- Professional ---                           [phone], Colonoscopy, flexible; with removal of                            tumor(s), polyp(s), or other lesion(s) by snare  technique Diagnosis Code(s):        --- Professional ---                           Z86.010, Personal history of colonic polyps                           K63.5, Polyp of colon                           K57.30, Diverticulosis of large intestine without                            perforation or abscess without bleeding CPT copyright 2019 American Medical Association. All rights reserved. The codes documented in this report are preliminary and upon coder review may  be revised to meet current compliance requirements. Cristopher Estimable. Eternity Dexter, MD Norvel Richards, MD 11/09/2018 9:44:35 AM This report has been signed electronically. Number of Addenda: 0

## 2018-11-09 NOTE — Anesthesia Procedure Notes (Signed)
Procedure Name: General with mask airway Performed by: Andree Elk Nike Southers A, CRNA Pre-anesthesia Checklist: Patient identified, Emergency Drugs available, Suction available, Patient being monitored and Timeout performed Patient Re-evaluated:Patient Re-evaluated prior to induction Oxygen Delivery Method: Non-rebreather mask

## 2018-11-09 NOTE — H&P (Signed)
@LOGO @   Primary Care Physician:  Celene Squibb, MD Primary Gastroenterologist:  Dr. Gala Romney  Pre-Procedure History & Physical: HPI:  Bryan Hurley is a 63 y.o. male here for surveillance colonoscopy.  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  History of diverticulosis/diverticulitis.  Past Medical History:  Diagnosis Date  . Diverticulitis   . Hyperlipidemia     Past Surgical History:  Procedure Laterality Date  . APPENDECTOMY    . COLONOSCOPY N/A 02/04/2016   Procedure: COLONOSCOPY;  Surgeon: Daneil Dolin, MD;  Location: [facility];  Service: Endoscopy;  Laterality: N/A;  9:30 AM - moved to 10/25 @ 10:30  . HEMORRHOID SURGERY    . VASECTOMY      Prior to Admission medications   Medication Sig Start Date End Date Taking? Authorizing Provider  Ascorbic Acid (VITAMIN C PO) Take 1 tablet by mouth daily.   Yes [provider]  aspirin 81 MG tablet Take 81 mg by mouth daily.    Yes [provider]  atorvastatin (LIPITOR) 10 MG tablet Take 10 mg by mouth daily.    Yes [provider]  calcium carbonate (TUMS - DOSED IN MG ELEMENTAL CALCIUM) 500 MG chewable tablet Chew 1 tablet by mouth daily as needed for indigestion or heartburn.   Yes [provider]  Multiple Vitamin (MULTIVITAMIN) tablet Take 1 tablet by mouth daily.   Yes [provider]  Omega-3 Fatty Acids (FISH OIL PO) Take 1 capsule by mouth daily.    Yes [provider]  TURMERIC PO Take 1 tablet by mouth daily.    Yes [provider]  VITAMIN D PO Take 1 tablet by mouth daily.    Yes [provider]  Na Sulfate-K Sulfate-Mg Sulf (SUPREP BOWEL PREP KIT) 17.5-3.13-1.6 GM/180ML SOLN Take 1 kit by mouth as directed. Patient not taking: Reported on 05/24/2018 12/31/15   Daneil Dolin, MD    Allergies as of 05/24/2018  . (No Known Allergies)    Family History  Problem Relation Age of Onset  . Stroke Father   .  Pancreatic cancer Sister   . Colon cancer Neg Hx     Social History   Socioeconomic History  . Marital status: Married    Spouse name: Not on file  . Number of children: Not on file  . Years of education: Not on file  . Highest education level: Not on file  Occupational History  . Not on file  Social Needs  . Financial resource strain: Not on file  . Food insecurity    Worry: Not on file    Inability: Not on file  . Transportation needs    Medical: Not on file    Non-medical: Not on file  Tobacco Use  . Smoking status: Current Every Day Smoker    Packs/day: 0.50    Years: 49.00    Pack years: 24.50    Types: Cigarettes    Start date: 05/24/1969  . Smokeless tobacco: Never Used  Substance and Sexual Activity  . Alcohol use: Yes    Comment: 3-4 beers almost daily and occasional mixed drink  . Drug use: Yes    Types: Marijuana  . Sexual activity: Not on file  Lifestyle  . Physical activity    Days per week: Not on file    Minutes per session: Not on file  . Stress: Not on file  Relationships  . Social connections  Talks on phone: Not on file    Gets together: Not on file    Attends religious service: Not on file    Active member of club or organization: Not on file    Attends meetings of clubs or organizations: Not on file    Relationship status: Not on file  . Intimate partner violence    Fear of current or ex partner: Not on file    Emotionally abused: Not on file    Physically abused: Not on file    Forced sexual activity: Not on file  Other Topics Concern  . Not on file  Social History Narrative  . Not on file    Review of Systems: See HPI, otherwise negative ROS  Physical Exam: BP 137/86   Pulse 82   Temp 98.2 F (36.8 C) (Oral)   Resp 19   Ht 5' 8"  (1.727 m)   Wt 86.6 kg   SpO2 95%   BMI 29.04 kg/m  General:   Alert,  Well-developed, well-nourished, pleasant and cooperative in NAD Mouth:  No deformity or lesions. Neck:  Supple; no masses  or thyromegaly. No significant cervical adenopathy. Lungs:  Clear throughout to auscultation.   No wheezes, crackles, or rhonchi. No acute distress. Heart:  Regular rate and rhythm; no murmurs, clicks, rubs,  or gallops. Abdomen: Non-distended, normal bowel sounds.  Soft and nontender without appreciable mass or hepatosplenomegaly.  Pulses:  Normal pulses noted. Extremities:  Without clubbing or edema.  Impression/Plan: 63 year old gentleman with a history of multiple colonic polyps removed previously.  Here for surveillance colonoscopy per plan.  The risks, benefits, limitations, alternatives and imponderables have been reviewed with the patient. Questions have been answered. All parties are agreeable.      Notice: This dictation was prepared with Dragon dictation along with smaller phrase technology. Any transcriptional errors that result from this process are unintentional and may not be corrected upon review.

## 2018-11-09 NOTE — Discharge Instructions (Signed)
Colonoscopy Discharge Instructions  Read the instructions outlined below and refer to this sheet in the next few weeks. These discharge instructions provide you with general information on caring for yourself after you leave the hospital. Your doctor may also give you specific instructions. While your treatment has been planned according to the most current medical practices available, unavoidable complications occasionally occur. If you have any problems or questions after discharge, call Dr. Gala Romney at [phone]. ACTIVITY  You may resume your regular activity, but move at a slower pace for the next 24 hours.   Take frequent rest periods for the next 24 hours.   Walking will help get rid of the air and reduce the bloated feeling in your belly (abdomen).   No driving for 24 hours (because of the medicine (anesthesia) used during the test).    Do not sign any important legal documents or operate any machinery for 24 hours (because of the anesthesia used during the test).  NUTRITION  Drink plenty of fluids.   You may resume your normal diet as instructed by your doctor.   Begin with a light meal and progress to your normal diet. Heavy or fried foods are harder to digest and may make you feel sick to your stomach (nauseated).   Avoid alcoholic beverages for 24 hours or as instructed.  MEDICATIONS  You may resume your normal medications unless your doctor tells you otherwise.  WHAT YOU CAN EXPECT TODAY  Some feelings of bloating in the abdomen.   Passage of more gas than usual.   Spotting of blood in your stool or on the toilet paper.  IF YOU HAD POLYPS REMOVED DURING THE COLONOSCOPY:  No aspirin products for 7 days or as instructed.   No alcohol for 7 days or as instructed.   Eat a soft diet for the next 24 hours.  FINDING OUT THE RESULTS OF YOUR TEST Not all test results are available during your visit. If your test results are not back during the visit, make an appointment  with your caregiver to find out the results. Do not assume everything is normal if you have not heard from your caregiver or the medical facility. It is important for you to follow up on all of your test results.  SEEK IMMEDIATE MEDICAL ATTENTION IF:  You have more than a spotting of blood in your stool.   Your belly is swollen (abdominal distention).   You are nauseated or vomiting.   You have a temperature over 101.   You have abdominal pain or discomfort that is severe or gets worse throughout the day.    Colon Polyps  Polyps are tissue growths inside the body. Polyps can grow in many places, including the large intestine (colon). A polyp may be a round bump or a mushroom-shaped growth. You could have one polyp or several. Most colon polyps are noncancerous (benign). However, some colon polyps can become cancerous over time. Finding and removing the polyps early can help prevent this. What are the causes? The exact cause of colon polyps is not known. What increases the risk? You are more likely to develop this condition if you:  Have a family history of colon cancer or colon polyps.  Are older than 94 or older than 45 if you are African American.  Have inflammatory bowel disease, such as ulcerative colitis or Crohn's disease.  Have certain hereditary conditions, such as: ? Familial adenomatous polyposis. ? Lynch syndrome. ? Turcot syndrome. ? Peutz-Jeghers syndrome.  Are  overweight.  Smoke cigarettes.  Do not get enough exercise.  Drink too much alcohol.  Eat a diet that is high in fat and red meat and low in fiber.  Had childhood cancer that was treated with abdominal radiation. What are the signs or symptoms? Most polyps do not cause symptoms. If you have symptoms, they may include:  Blood coming from your rectum when having a bowel movement.  Blood in your stool. The stool may look dark red or black.  Abdominal pain.  A change in bowel habits, such as  constipation or diarrhea. How is this diagnosed? This condition is diagnosed with a colonoscopy. This is a procedure in which a lighted, flexible scope is inserted into the anus and then passed into the colon to examine the area. Polyps are sometimes found when a colonoscopy is done as part of routine cancer screening tests. How is this treated? Treatment for this condition involves removing any polyps that are found. Most polyps can be removed during a colonoscopy. Those polyps will then be tested for cancer. Additional treatment may be needed depending on the results of testing. Follow these instructions at home: Lifestyle  Maintain a healthy weight, or lose weight if recommended by your health care provider.  Exercise every day or as told by your health care provider.  Do not use any products that contain nicotine or tobacco, such as cigarettes and e-cigarettes. If you need help quitting, ask your health care provider.  If you drink alcohol, limit how much you have: ? 0-1 drink a day for women. ? 0-2 drinks a day for men.  Be aware of how much alcohol is in your drink. In the [place] one drink equals one 12 oz bottle of beer (355 mL), one 5 oz glass of wine (148 mL), or one 1 oz shot of hard liquor (44 mL). Eating and drinking   Eat foods that are high in fiber, such as fruits, vegetables, and whole grains.  Eat foods that are high in calcium and vitamin D, such as milk, cheese, yogurt, eggs, liver, fish, and broccoli.  Limit foods that are high in fat, such as fried foods and desserts.  Limit the amount of red meat and processed meat you eat, such as hot dogs, sausage, bacon, and lunch meats. General instructions  Keep all follow-up visits as told by your health care provider. This is important. ? This includes having regularly scheduled colonoscopies. ? Talk to your health care provider about when you need a colonoscopy. Contact a health care provider if:  You have new or  worsening bleeding during a bowel movement.  You have new or increased blood in your stool.  You have a change in bowel habits.  You lose weight for no known reason. Summary  Polyps are tissue growths inside the body. Polyps can grow in many places, including the colon.  Most colon polyps are noncancerous (benign), but some can become cancerous over time.  This condition is diagnosed with a colonoscopy.  Treatment for this condition involves removing any polyps that are found. Most polyps can be removed during a colonoscopy. This information is not intended to replace advice given to you by your health care provider. Make sure you discuss any questions you have with your health care provider. Document Released: 12/24/2003 Document Revised: 07/14/2017 Document Reviewed: 07/14/2017 Elsevier Patient Education  2020 [employer].      Diverticulosis  Diverticulosis is a condition that develops when small pouches (diverticula) form in the  wall of the large intestine (colon). The colon is where water is absorbed and stool is formed. The pouches form when the inside layer of the colon pushes through weak spots in the outer layers of the colon. You may have a few pouches or many of them. What are the causes? The cause of this condition is not known. What increases the risk? The following factors may make you more likely to develop this condition:  Being older than age 52. Your risk for this condition increases with age. Diverticulosis is rare among people younger than age 75. By age 27, many people have it.  Eating a low-fiber diet.  Having frequent constipation.  Being overweight.  Not getting enough exercise.  Smoking.  Taking over-the-counter pain medicines, like aspirin and ibuprofen.  Having a family history of diverticulosis. What are the signs or symptoms? In most people, there are no symptoms of this condition. If you do have symptoms, they may  include:  Bloating.  Cramps in the abdomen.  Constipation or diarrhea.  Pain in the lower left side of the abdomen. How is this diagnosed? This condition is most often diagnosed during an exam for other colon problems. Because diverticulosis usually has no symptoms, it often cannot be diagnosed independently. This condition may be diagnosed by:  Using a flexible scope to examine the colon (colonoscopy).  Taking an X-ray of the colon after dye has been put into the colon (barium enema).  Doing a CT scan. How is this treated? You may not need treatment for this condition if you have never developed an infection related to diverticulosis. If you have had an infection before, treatment may include:  Eating a high-fiber diet. This may include eating more fruits, vegetables, and grains.  Taking a fiber supplement.  Taking a live bacteria supplement (probiotic).  Taking medicine to relax your colon.  Taking antibiotic medicines. Follow these instructions at home:  Drink 6-8 glasses of water or more each day to prevent constipation.  Try not to strain when you have a bowel movement.  If you have had an infection before: ? Eat more fiber as directed by your health care provider or your diet and nutrition specialist (dietitian). ? Take a fiber supplement or probiotic, if your health care provider approves.  Take over-the-counter and prescription medicines only as told by your health care provider.  If you were prescribed an antibiotic, take it as told by your health care provider. Do not stop taking the antibiotic even if you start to feel better.  Keep all follow-up visits as told by your health care provider. This is important. Contact a health care provider if:  You have pain in your abdomen.  You have bloating.  You have cramps.  You have not had a bowel movement in 3 days. Get help right away if:  Your pain gets worse.  Your bloating becomes very bad.  You have a  fever or chills, and your symptoms suddenly get worse.  You vomit.  You have bowel movements that are bloody or black.  You have bleeding from your rectum. Summary  Diverticulosis is a condition that develops when small pouches (diverticula) form in the wall of the large intestine (colon).  You may have a few pouches or many of them.  This condition is most often diagnosed during an exam for other colon problems.  If you have had an infection related to diverticulosis, treatment may include increasing the fiber in your diet, taking supplements, or taking  medicines. This information is not intended to replace advice given to you by your health care provider. Make sure you discuss any questions you have with your health care provider. Document Released: 12/25/2003 Document Revised: 03/11/2017 Document Reviewed: 02/16/2016 Elsevier Patient Education  2020 [employer].    Colon polyp and diverticulosis information provided  Further recommendations to follow pending review of pathology report  I have discussed my findings and recommendations with Vaughan Basta at [phone]

## 2018-11-09 NOTE — Anesthesia Preprocedure Evaluation (Signed)
Anesthesia Evaluation  Patient identified by MRN, date of birth, ID band Patient awake    Reviewed: Allergy & Precautions, NPO status , Patient's Chart, lab work & pertinent test results  Airway Mallampati: II  TM Distance: >3 FB Neck ROM: Full    Dental no notable dental hx. (+) Teeth Intact   Pulmonary neg pulmonary ROS, Current Smoker,    Pulmonary exam normal breath sounds clear to auscultation       Cardiovascular Exercise Tolerance: Good negative cardio ROS Normal cardiovascular examI Rhythm:Regular Rate:Normal     Neuro/Psych negative neurological ROS  negative psych ROS   GI/Hepatic Neg liver ROS, GERD  Medicated and Controlled,Occ Tums -denies GERD Sx today   Endo/Other  negative endocrine ROS  Renal/GU negative Renal ROS  negative genitourinary   Musculoskeletal negative musculoskeletal ROS (+)   Abdominal   Peds negative pediatric ROS (+)  Hematology negative hematology ROS (+)   Anesthesia Other Findings On Lipitor  Reproductive/Obstetrics negative OB ROS                             Anesthesia Physical Anesthesia Plan  ASA: II  Anesthesia Plan: General   Post-op Pain Management:    Induction: Intravenous  PONV Risk Score and Plan: 1 and Propofol infusion, TIVA and Treatment may vary due to age or medical condition  Airway Management Planned: Simple Face Mask and Nasal Cannula  Additional Equipment:   Intra-op Plan:   Post-operative Plan:   Informed Consent: I have reviewed the patients History and Physical, chart, labs and discussed the procedure including the risks, benefits and alternatives for the proposed anesthesia with the patient or authorized representative who has indicated his/her understanding and acceptance.     Dental advisory given  Plan Discussed with: CRNA  Anesthesia Plan Comments: (Plan Full PPE  Plan GA with GETA as needed d/w pt -WTP  with same after Q&A)        Anesthesia Quick Evaluation

## 2018-11-09 NOTE — Anesthesia Postprocedure Evaluation (Signed)
Anesthesia Post Note  Patient: [name]  Procedure(s) Performed: COLONOSCOPY WITH PROPOFOL (N/A ) POLYPECTOMY  Patient location during evaluation: PACU Anesthesia Type: General Level of consciousness: awake and alert, oriented and patient cooperative Pain management: pain level controlled Vital Signs Assessment: post-procedure vital signs reviewed and stable Respiratory status: spontaneous breathing Cardiovascular status: stable Postop Assessment: no apparent nausea or vomiting Anesthetic complications: no     Last Vitals:  Vitals:   11/09/18 0835  BP: 137/86  Pulse: 82  Resp: 19  Temp: 36.8 C  SpO2: 95%    Last Pain:  Vitals:   11/09/18 0917  TempSrc:   PainSc: 0-No pain                 Chany Woolworth A

## 2018-11-09 NOTE — Transfer of Care (Signed)
Immediate Anesthesia Transfer of Care Note  Patient: MARLAND REINE  Procedure(s) Performed: COLONOSCOPY WITH PROPOFOL (N/A ) POLYPECTOMY  Patient Location: PACU  Anesthesia Type:General  Level of Consciousness: awake, alert , oriented and patient cooperative  Airway & Oxygen Therapy: Patient Spontanous Breathing  Post-op Assessment: Report given to RN and Post -op Vital signs reviewed and stable  Post vital signs: Reviewed and stable  Last Vitals:  Vitals Value Taken Time  BP 115/68 11/09/18 0940  Temp    Pulse 74 11/09/18 0942  Resp 23 11/09/18 0942  SpO2 96 % 11/09/18 0942  Vitals shown include unvalidated device data.  Last Pain:  Vitals:   11/09/18 0917  TempSrc:   PainSc: 0-No pain         Complications: No apparent anesthesia complications

## 2018-11-10 ENCOUNTER — Encounter: Payer: Self-pay | Admitting: Internal Medicine

## 2018-11-20 ENCOUNTER — Encounter (HOSPITAL_COMMUNITY): Payer: Self-pay | Admitting: Internal Medicine

## 2020-08-08 ENCOUNTER — Other Ambulatory Visit: Payer: Self-pay | Admitting: Internal Medicine

## 2020-08-08 ENCOUNTER — Other Ambulatory Visit (HOSPITAL_COMMUNITY): Payer: Self-pay | Admitting: Internal Medicine

## 2020-08-08 DIAGNOSIS — Z87891 Personal history of nicotine dependence: Secondary | ICD-10-CM

## 2020-08-14 ENCOUNTER — Ambulatory Visit (HOSPITAL_COMMUNITY)
Admission: RE | Admit: 2020-08-14 | Discharge: 2020-08-14 | Disposition: A | Discharge status: Home / Self Care | Payer: Medicare Other | Source: Ambulatory Visit | Attending: Internal Medicine | Admitting: Internal Medicine

## 2020-08-14 ENCOUNTER — Other Ambulatory Visit: Payer: Self-pay

## 2020-08-14 DIAGNOSIS — Z87891 Personal history of nicotine dependence: Secondary | ICD-10-CM | POA: Diagnosis present

## 2021-09-07 NOTE — Addendum Note (Signed)
Encounter addended by: Annie Paras on: 09/07/2021 1:41 PM  Actions taken: Letter saved

## 2022-08-17 ENCOUNTER — Other Ambulatory Visit (HOSPITAL_COMMUNITY): Payer: Self-pay | Admitting: Internal Medicine

## 2022-08-17 DIAGNOSIS — F17200 Nicotine dependence, unspecified, uncomplicated: Secondary | ICD-10-CM

## 2022-08-18 ENCOUNTER — Other Ambulatory Visit (HOSPITAL_COMMUNITY): Payer: Self-pay | Admitting: Internal Medicine

## 2022-08-18 DIAGNOSIS — F17200 Nicotine dependence, unspecified, uncomplicated: Secondary | ICD-10-CM

## 2022-08-18 DIAGNOSIS — Z87891 Personal history of nicotine dependence: Secondary | ICD-10-CM

## 2022-09-03 IMAGING — CT CT CHEST LUNG CANCER SCREENING LOW DOSE W/O CM
3 of 4 series · 14 of 36 positions shown, 15 images · non-contrast
Comparison: None.

CLINICAL DATA: 64-year-old male current smoker, with 50 pack-year
history of smoking, for initial lung cancer screening

EXAM:
CT CHEST WITHOUT CONTRAST LOW-DOSE FOR LUNG CANCER SCREENING
TECHNIQUE: Multidetector CT imaging of the chest was performed following the
standard protocol without IV contrast.

[Series 2: axial st · axial · 0.88mm/px · z∈[+1287,+1437]mm · 3 of 62 slices shown, 4 images]
[im 16/62  mediastinal]
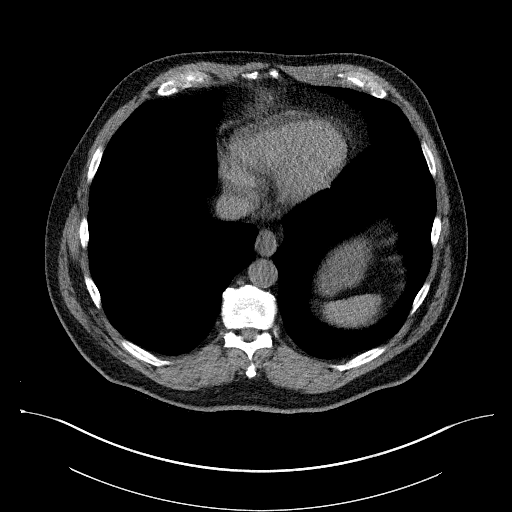
[im 16/62  lung]
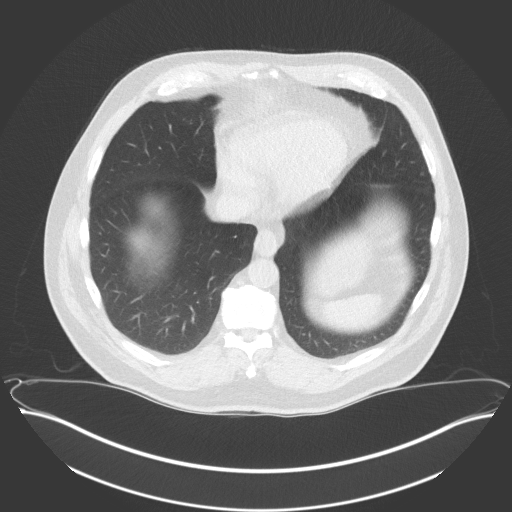
[im 31/62  lung]
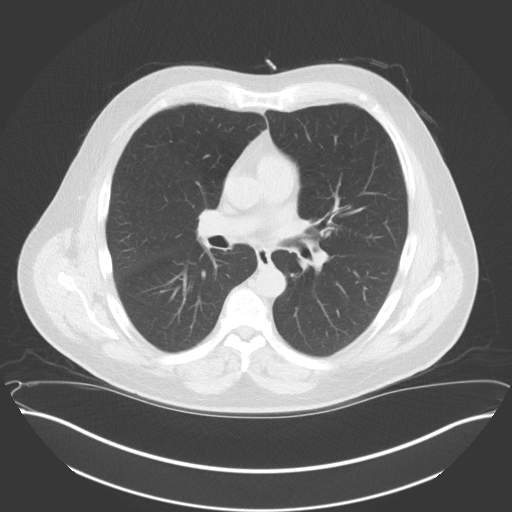
[im 46/62  lung]
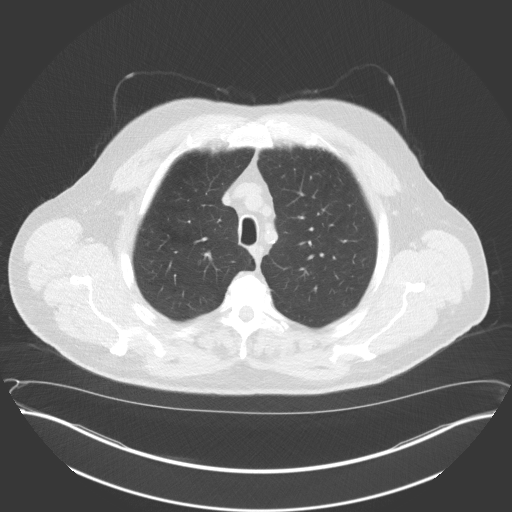

[Series 4: lungs · axial · 0.75mm/px · z∈[+1253,+1484]mm · 8 of 283 slices shown]
[im 26/283  lung]
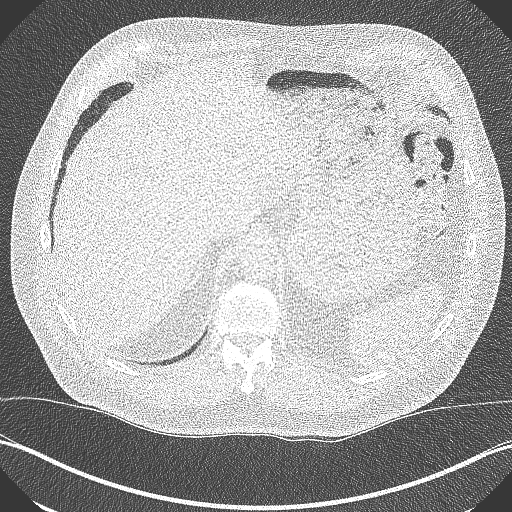
[im 52/283  lung]
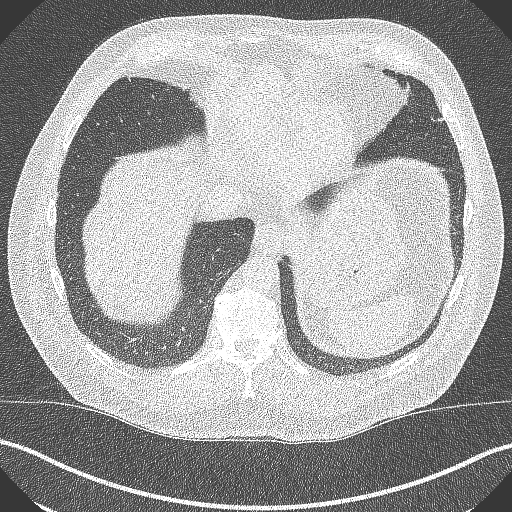
[im 90/283  lung]
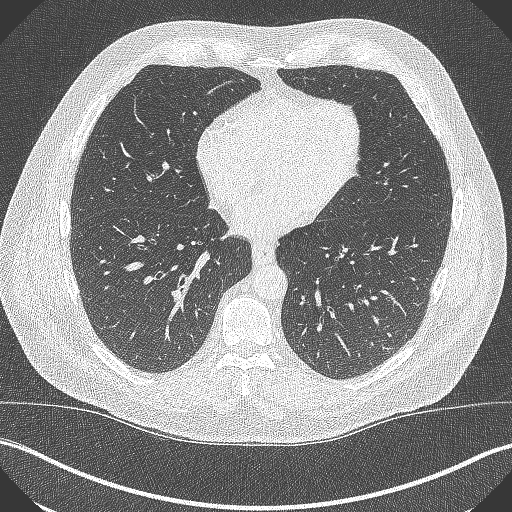
[im 129/283  lung]
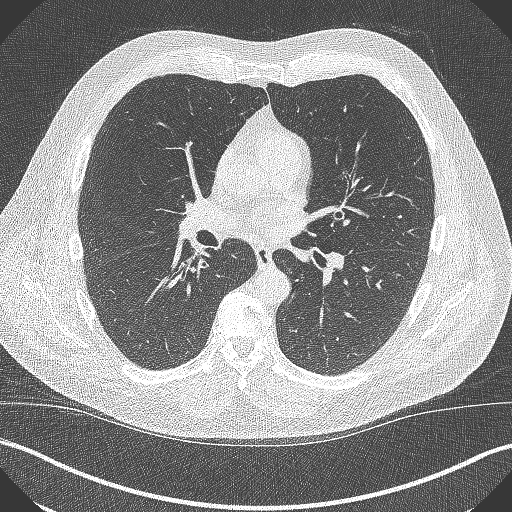
[im 154/283  lung]
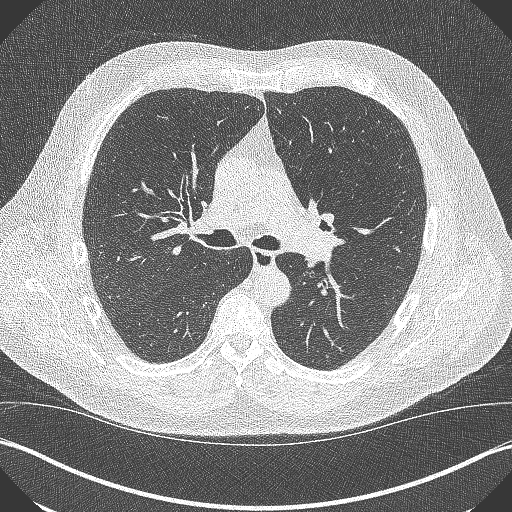
[im 193/283  lung]
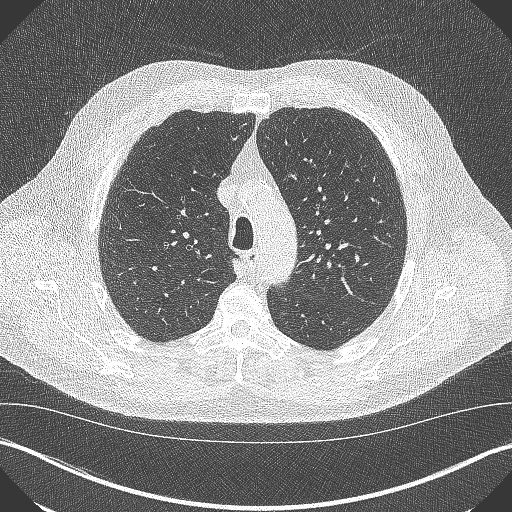
[im 231/283  lung]
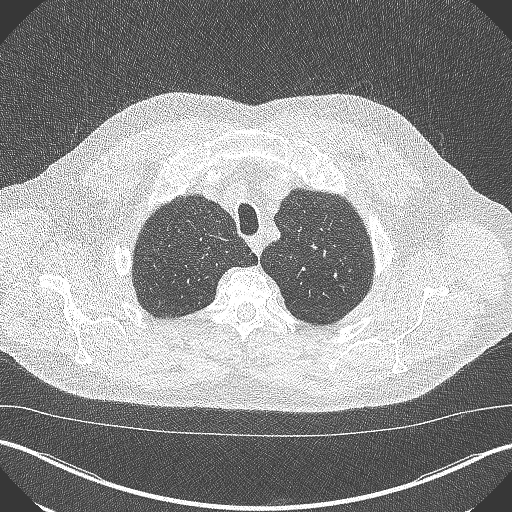
[im 257/283  lung]
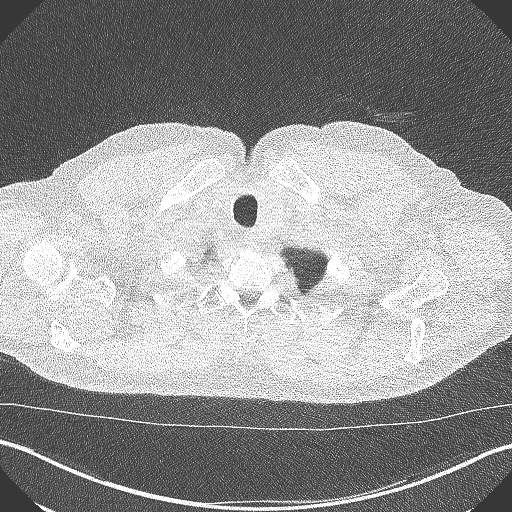

[Series 6: coronal · coronal · 0.69mm/px · 3 of 324 slices shown]
[im 65/324  lung]
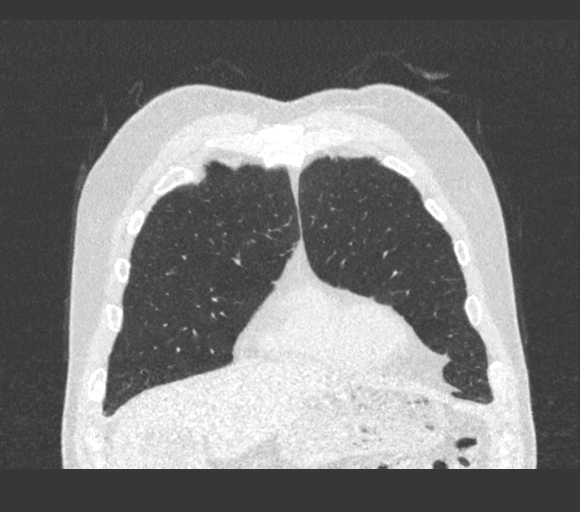
[im 130/324  lung]
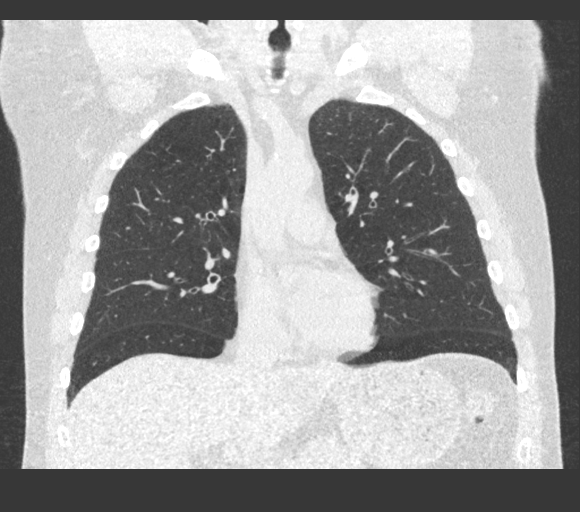
[im 194/324  lung]
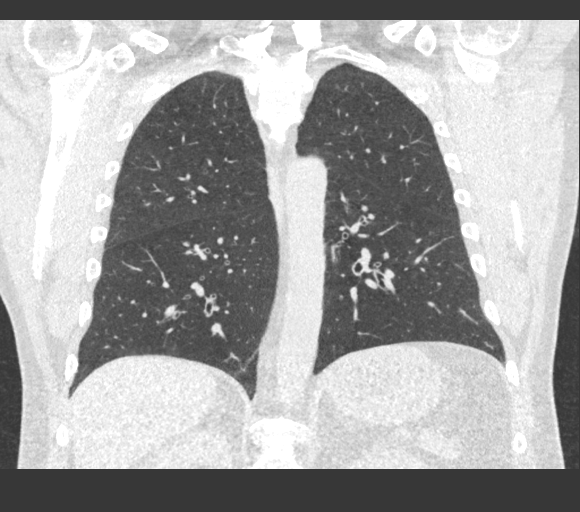

[14 of 36 positions shown; findings below may reference images not displayed]

FINDINGS: Cardiovascular: The heart is normal in size. No pericardial
effusion.

No evidence of thoracic aortic aneurysm. Atherosclerotic
calcifications of the aortic arch.

Three vessel coronary atherosclerosis.

Mediastinum/Nodes: No suspicious mediastinal lymphadenopathy.

Visualized thyroid is unremarkable.

Lungs/Pleura: Mild centrilobular and paraseptal emphysematous
changes, upper lung predominant.

No focal consolidation.

No suspicious pulmonary nodules.

No pleural effusion or pneumothorax.

Upper Abdomen: Visualized upper abdomen is notable for scattered
hepatic cysts measuring up to 4.7 cm (series 2/image 62).

Musculoskeletal: Mild degenerative changes of the visualized
thoracolumbar spine.
IMPRESSION: Lung-RADS 1, negative. Continue annual screening with low-dose chest
CT without contrast in 12 months.

Aortic Atherosclerosis (19FST-7IZ.Z) and Emphysema (19FST-PHB.0).

## 2022-09-30 ENCOUNTER — Ambulatory Visit (HOSPITAL_COMMUNITY): Payer: BC Managed Care – PPO

## 2022-10-12 ENCOUNTER — Ambulatory Visit (HOSPITAL_COMMUNITY)
Admission: RE | Admit: 2022-10-12 | Discharge: 2022-10-12 | Disposition: A | Discharge status: Home / Self Care | Payer: Medicare Other | Source: Ambulatory Visit | Attending: Internal Medicine | Admitting: Internal Medicine

## 2022-10-12 DIAGNOSIS — F17200 Nicotine dependence, unspecified, uncomplicated: Secondary | ICD-10-CM | POA: Diagnosis present

## 2022-10-12 DIAGNOSIS — Z87891 Personal history of nicotine dependence: Secondary | ICD-10-CM | POA: Insufficient documentation

## 2023-10-20 ENCOUNTER — Encounter (INDEPENDENT_AMBULATORY_CARE_PROVIDER_SITE_OTHER): Payer: Self-pay | Admitting: *Deleted

## 2023-10-31 ENCOUNTER — Other Ambulatory Visit (HOSPITAL_COMMUNITY): Payer: Self-pay | Admitting: Internal Medicine

## 2023-10-31 DIAGNOSIS — Z87891 Personal history of nicotine dependence: Secondary | ICD-10-CM

## 2023-10-31 DIAGNOSIS — F17200 Nicotine dependence, unspecified, uncomplicated: Secondary | ICD-10-CM

## 2023-11-21 ENCOUNTER — Ambulatory Visit (HOSPITAL_COMMUNITY): Admission: RE | Admit: 2023-11-21 | Source: Ambulatory Visit

## 2023-11-21 ENCOUNTER — Encounter (HOSPITAL_COMMUNITY): Payer: Self-pay

## 2023-11-21 ENCOUNTER — Ambulatory Visit (HOSPITAL_COMMUNITY)
Admission: RE | Admit: 2023-11-21 | Discharge: 2023-11-21 | Disposition: A | Discharge status: Home / Self Care | Source: Ambulatory Visit | Attending: Internal Medicine | Admitting: Internal Medicine

## 2023-11-21 ENCOUNTER — Ambulatory Visit (HOSPITAL_COMMUNITY): Attending: Internal Medicine

## 2023-11-21 DIAGNOSIS — Z87891 Personal history of nicotine dependence: Secondary | ICD-10-CM | POA: Diagnosis present

## 2023-11-21 DIAGNOSIS — F17200 Nicotine dependence, unspecified, uncomplicated: Secondary | ICD-10-CM | POA: Insufficient documentation
